# Patient Record
Sex: Female | Born: 1995 | Race: Black or African American | Hispanic: No | Marital: Single | Smoking: Never smoker
Health system: Southern US, Community
[De-identification: ages and names within clinical notes are randomized; demographics above are authoritative.]

---

## 2014-10-24 ENCOUNTER — Other Ambulatory Visit: Payer: Self-pay | Admitting: Nurse Practitioner

## 2014-10-24 DIAGNOSIS — N912 Amenorrhea, unspecified: Secondary | ICD-10-CM

## 2014-10-29 ENCOUNTER — Ambulatory Visit
Admission: RE | Admit: 2014-10-29 | Discharge: 2014-10-29 | Disposition: A | Discharge status: Home / Self Care | Payer: BLUE CROSS/BLUE SHIELD | Source: Ambulatory Visit | Attending: Nurse Practitioner | Admitting: Nurse Practitioner

## 2014-10-29 DIAGNOSIS — N912 Amenorrhea, unspecified: Secondary | ICD-10-CM

## 2014-11-02 ENCOUNTER — Other Ambulatory Visit: Payer: Self-pay | Admitting: Nurse Practitioner

## 2014-11-02 DIAGNOSIS — N838 Other noninflammatory disorders of ovary, fallopian tube and broad ligament: Secondary | ICD-10-CM

## 2014-11-12 ENCOUNTER — Ambulatory Visit
Admission: RE | Admit: 2014-11-12 | Discharge: 2014-11-12 | Disposition: A | Discharge status: Home / Self Care | Payer: BLUE CROSS/BLUE SHIELD | Source: Ambulatory Visit | Attending: Nurse Practitioner | Admitting: Nurse Practitioner

## 2014-11-12 DIAGNOSIS — N838 Other noninflammatory disorders of ovary, fallopian tube and broad ligament: Secondary | ICD-10-CM

## 2014-11-12 MED ORDER — GADOBENATE DIMEGLUMINE 529 MG/ML IV SOLN
20.0000 mL | Freq: Once | INTRAVENOUS | Status: AC | PRN
  Administered 2014-11-12: 20 mL via INTRAVENOUS

## 2014-12-24 ENCOUNTER — Ambulatory Visit (INDEPENDENT_AMBULATORY_CARE_PROVIDER_SITE_OTHER): Payer: BLUE CROSS/BLUE SHIELD | Admitting: Internal Medicine

## 2014-12-24 ENCOUNTER — Encounter: Payer: Self-pay | Admitting: Internal Medicine

## 2014-12-24 VITALS — BP 122/82 | HR 72 | Temp 98.1°F | Ht 70.5 in | Wt 251.0 lb

## 2014-12-24 DIAGNOSIS — E288 Other ovarian dysfunction: Secondary | ICD-10-CM

## 2014-12-24 DIAGNOSIS — N910 Primary amenorrhea: Secondary | ICD-10-CM

## 2014-12-24 DIAGNOSIS — E2839 Other primary ovarian failure: Secondary | ICD-10-CM

## 2014-12-24 LAB — FOLLICLE STIMULATING HORMONE: FSH: 63.2 m[IU]/mL

## 2014-12-24 LAB — T4, FREE: FREE T4: 0.7 ng/dL (ref 0.60–1.60)

## 2014-12-24 LAB — T3, FREE: T3 FREE: 3.7 pg/mL (ref 2.3–4.2)

## 2014-12-24 LAB — TSH: TSH: 1.26 u[IU]/mL (ref 0.35–5.50)

## 2014-12-24 LAB — LUTEINIZING HORMONE: LH: 20.84 m[IU]/mL

## 2014-12-24 NOTE — Progress Notes (Signed)
Patient ID: Jenna Martinez, female   DOB: [birth year], 19 y.o.   MRN: [id number]  HPI: Jenna Martinez is a 19 y.o. female, referred by her PCP,  Revonda Standard, FNP, in consultation for primary amenorrhea.  She saw an ObGyn at 62-11 y/o as she did not have a menstrual cycle. She had investigation then, but does not remember if she was given a diagnosis. She believes that she did not have breast buds at the time. She started Estrogen 500 mg bid >> started to develop breasts >> stopped Estrogen in Spring 2016.  Pt describes: - no menarche - painful intercourse (due to vaginal dryness) - children: 0 - miscarriages: 0 - contraception: no  She denies acne, hirsutism, significant weight gain, wide, purple stretch marks.  Previous investigation - imaging showed small uterus and ovaries with a cyst in the right ovary that could be a dermoid cyst: 10/29/2014 Transvaginal ultrasound: Small uterus for the patient's age. No endometrial abnormality  2 cm echogenic complex right adnexal/ovarian masslike abnormality separate from surrounding peristalsing bowel, could represent an ovarian dermoid. No other cross-sectional imaging of the pelvis for comparison. Consider further evaluation with a pelvic MRI without and with contrast.  14 mm left ovarian dominant follicle versus small cyst  Trace pelvic free fluid likely physiologic  11/12/2014 Pelvic MRI with and without contrast:  1. Diminutive uterus and ovaries, suggesting a component of hypogonadism or uterine hypoplasia. Correlate with clinical signs of delayed puberty. 2. No evidence of right ovarian or adnexal mass; no correlate for the ultrasound abnormality which may have been artifactual. 3. Trace cul-de-sac fluid may be physiologic.  Lab work 10/24/2014: - TSH 1.802, free T4 0.97 (0.8-1.8) - DHEA 431 ([id number])  - Prolactin 8.4 (2.8-21.2)  - Total testosterone 22 (2-45), SHBG 61 (17-124), free testosterone 2.2  (0.1-6.4) She also has a history of vitamin D deficiency, although I do not have those records.  She has no FH of infertility, amenorrhea. Late menarche in paternal aunt.  ROS: Constitutional: + weight gain, + fatigue, + subjective hyperthermia Eyes: no blurry vision, no xerophthalmia ENT: no sore throat, no nodules palpated in throat, no dysphagia/odynophagia, no hoarseness, + tinnitus Cardiovascular: no CP/+ SOB/no palpitations/leg swelling Respiratory: no cough/+ SOB Gastrointestinal: no N/V/D/C Musculoskeletal: no muscle/joint aches Skin: no acne, no hair on face, no dark discoloration of skin Neurological: no tremors/numbness/tingling/dizziness Psychiatric: no depression/anxiety  PMH: - see HPI  PSxH: - tonsillectomy 2000   Social History   Social History  . Marital Status: Single    Spouse Name: N/A  . Number of Children: 0   Occupational History  . [employer]   Social History Main Topics  . Smoking status: Never Smoker   . Smokeless tobacco: Not on file  . Alcohol Use: 3 drinks 1x a mo  . Drug Use: No   Not taking any prescription medication currently.   No Known Allergies   FH: - diabetes in aunt and cousin, hypertension in aunt  PE: BP 122/82 mmHg  Pulse 72  Temp(Src) 98.1 F (36.7 C) (Oral)  Ht 5' 10.5" (1.791 m)  Wt 251 lb (113.853 kg)  BMI 35.49 kg/m2 Wt Readings from Last 3 Encounters:  12/24/14 251 lb (113.853 kg)   Constitutional: overweight, in NAD, no full supraclavicular fat pads Eyes: PERRLA, EOMI, no exophthalmos ENT: moist mucous membranes, no thyromegaly, no cervical lymphadenopathy Cardiovascular: RRR, No MRG Respiratory: CTA B Gastrointestinal: abdomen soft, NT, ND, BS+ Musculoskeletal: no deformities, strength intact in all 4 Skin: moist,  warm; no acne on face, no dark terminal hair on chin, + acanthosis nigricans on neck, no purple, wide, stretch marks Neurological: no tremor with outstretched hands, DTR normal in all  4  ASSESSMENT: 1. Primary amenorrhea  PLAN: 1.  I had a long discussion with the patient about possible causes of primary amenorrhea (excluding disorders with absent uterus and ovaries, since she has these, although small, on ultrasound)  She is not on any medicines that can cause this problem  No excessive exerciser dieting to suggest functional hypothalamic amenorrhea  No chronic systemic illnesses: celiac disease or DM1, to trigger hypogonadotropic hypogonadism  No galactorrhea or elevated prolactin to suggest a prolactinoma  No hypothyroidism/hyperthyroidism per review of recent TSH  doubt outflow obstruction based on the results of her pelvic ultrasound and her MRI  She does not have other features of PCOS: acne, hirsutism, high testosterone, multiple ovarian cysts. We discussed about what the syndrome means. I will recheck a testosterone, androstenedione, LH, FSH  She does not have other autoimmune diseases to suggest autoimmune premature ovarian insufficiency, but I would like to check adrenal antibodies since this is the best test to evaluate for ovarian autoimmunity (not ovarian antibodies, which are not specific enough). We discussed about POI and the fact that if she does have this, she will need hormone replacement therapy to preserve her bones. I explained that usually the treatment is with estradiol patch and po Prometrium.  To further investigate for premature ovarian insufficiency, I will check an inhibin B level to evaluate her ovarian reserve  Doubt Netarts-CAH because no signs of hyperandrogenism, but would like to check a 17 hydroxyprogesterone level to make sure  We may need to rule out fragile X syndrome by FMR testing   Doubt Turner syndrome since she is tall, but she may have a Turner mosaic profile - patient tells me that she had a karyotype during her evaluation as a child and was told that "she is a woman". We'll try to obtain the records. Patient signed a release  of information sheet. - I discussed with her that if she turns out to have POI, we may refer her to genetics to see if we can find out the exact cause for this, if the above investigation is negative. - Patient would like to know if she can never have children, and we discussed that I am not sure, we may need to refer to reproductive endocrinology for an evaluation. She does not plan to get pregnant right now, but she would like to know what her chances are. - I will order the following labs: Orders Placed This Encounter  Procedures  . Follicle stimulating hormone  . Luteinizing hormone  . T3, free  . T4, free  . TSH  . 21-Hydroxylase Antibodies  . 17-Hydroxyprogesterone  . Testosterone,Free and Total  . Androstenedione  . DHEA-sulfate  . Estradiol  . Inhibin B  . Anti mullerian hormone   - time spent with the patient: 1 hour, of which >50% was spent in obtaining information about her amenorrhea, reviewing her previous labs, evaluations, and treatments, counseling her about her condition (please see the discussed topics above), and developing a plan to further investigate it; she had a number of questions which I addressed.  Office Visit on 12/24/2014  Component Date Value Ref Range Status  . [facility] 12/24/2014 63.2   Final   Female Reference Range:  1.4-18.1 mIU/mLFemale Reference Range:Follicular Phase  2.5-10.2 mIU/mLMidCycle Peak          3.4-33.4 mIU/mLLuteal Phase          1.5-9.1 mIU/mLPost Menopausal     23.0-116.3 mIU/mLPregnant          <0.3 mIU/mL  . [facility] 12/24/2014 20.84   Final   Comment: Female Reference Range:20-70 yrs     1.5-9.3 mIU/mL>70 yrs       3.1-35.6 mIU/mLFemale Reference Range:Follicular Phase     1.9-12.5 mIU/mLMidcycle             8.7-76.3 mIU/mLLuteal Phase         0.5-16.9 mIU/mL  Post Menopausal      15.9-54.0  mIU/mLPregnant             <1.5 mIU/mLContraceptives       0.7-5.6 mIU/mL   . T3, Free 12/24/2014 3.7  2.3 - 4.2 pg/mL Final  . Free T4 12/24/2014  0.70  0.60 - 1.60 ng/dL Final  . TSH 16/02/9603 1.26  0.35 - 5.50 uIU/mL Final  . 21-Hydroxylase Antibodies 12/24/2014 <1.0   Final   Comment: Reference Range: All Ages: < 1.0   . 17-OH-Progesterone, LC/MS/MS 12/24/2014 9   Final   Comment:   Adult Female Reference Ranges for   17-Hydroxyprogesterone, LC/MS/MS:      Follicular Phase:       < or = 185 ng/dL    Luteal Phase:           < or = 285 ng/dL    Postmenopausal Phase:   < or = 45 ng/dL    Pregnancy:    First Trimester:  78-457 ng/dL    Second Trimester: 54-098 ng/dL    Third Trimester:  119-147 ng/dL   . Testosterone 12/24/2014 21  8 - 48 ng/dL Final  . Testosterone, Free 12/24/2014 1.3  0.0 - 4.2 pg/mL Final  . Androstenedione 12/24/2014 63   Final   Comment:   Adult Female Reference Ranges for   Androstenedione, Serum:      Follicular Phase:      35-250 ng/dL    Luteal Phase:          30-235 ng/dL    Postmenopausal Phase:  20-75  ng/dL   . DHEA-SO4 12/24/2014 284  51 - 321 ug/dL Final   Comment:         Tanner stages   (7-17 Years)                 Female            Female Tanner I        <or=89 ug/dL    <WG=95 ug/dL Tanner II       <AO=13 ug/dL    08-657 ug/dL Tanner III      84-696 ug/dL    29-528 ug/dL Tanner IV       41-324 ug/dL    40-102 ug/dL Tanner V       725-366 ug/dL    44-034 ug/dL   DHEA-S values fall with advancing age. For reference, the reference intervals for 77-24 year old patients are: Female 23-266 ug/dL and Female 742-595 ug/dL.   . Estradiol 12/24/2014 16.3   Final   Comment:      Males                           0.0 -  39.0 pg/mL      Menstruating Females (by day in  cycle relative to LH peak)      Follicular phase (-12 to -4)   19.5 - 144.2 pg/mL      Midcycle          (-3 to +2)   63.9 - 356.7 pg/mL        Postmenopausal Females          0.0 -  32.2 pg/mL      (untreated)     . Inhibin B 12/24/2014 <10   Final   Comment:                  REFERENCE RANGE for Inhibin B                       =============================         Age:                Female:               Female:     5 - 9.9 years      21 - 166 pg/mL       < or = 18 pg/mL    10 - 13.9 years     41 - 328 pg/mL       < or = 86 pg/mL    14 - 17.9 years     54 - 295 pg/mL       < or = 123 pg/mL       > = 18 years     47 - 308 pg/mL   Female:   Pre-menopausal   < 153 pg/mL           Post-menopausal   < 10 pg/mL     Values obtained from different assay methods cannot be   used interchangeably. Inhibin B levels, regardless of   value, should not be interpreted as absolute evidence   of the presence or absence of disease.    This test was developed and its analytical performance  characteristics have been determined by [facility] [place], [place]. It has not been cleared or approved by  the FDA. This assay has been validated pursuant to the CLIA  regulations and is used for clinical purposes.   . AMH AssessR 12/24/2014 < 0.03   Final   Comment:    REFERENCE RANGES for AMH/MIS:                           Age         Expected range                                         (ng/mL)        -------------------------------------------        Female:          <14 yrs        0.49-3.15                       14-19 yrs        1.28-16.37                       20-29 yrs        0.76-11.34  30-39 yrs            <9.24                       40-49 yrs            <4.50                        > 49 yrs            <0.45          Female:            <1  yr         37.20-345.67                        1-6  yrs        59.54-320.65                        7-11 yrs        40.99-203.67                       12-17 yrs           <128.29                        > 17 yrs        1.15-15.23    This test(s) was developed and its performance characteristics  have been determined by [employer],  [place], [place]. Performance characteristics refer to the analytical  performance of the  test. 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 She has estrogen >> some ovarian fxn present. Inhibin B and AMH undetectable >> very decreased ovarian reserve.  The rest of the labs are normal: - Adrenal antibodies undetectable >> no sign of adrenal or ovarian autoimmunity. - TFTs normal >> no hypothyroidism - testosterone not high >> no PCOS or ovulation - androstenedione/DHEAS normal >> no excess adrenal androgens - 17 hydroxyprogesterone normal >> no congenital adrenal hyperplasia  Will check a Karyotype and an FMR mutation next. I will addend the results when they become available. If negative >> will refer to genetics.

## 2014-12-24 NOTE — Patient Instructions (Signed)
Please stop at the lab.  Please come back for a follow-up appointment in 3 months  

## 2014-12-25 LAB — ESTRADIOL: Estradiol: 16.3 pg/mL

## 2014-12-25 LAB — DHEA-SULFATE: DHEA-SO4: 284 ug/dL (ref 51–321)

## 2014-12-27 LAB — ANDROSTENEDIONE: Androstenedione: 63 ng/dL

## 2014-12-27 LAB — 17-HYDROXYPROGESTERONE: 17-OH-PROGESTERONE, LC/MS/MS: 9 ng/dL

## 2015-01-03 LAB — TESTOSTERONE,FREE AND TOTAL
Testosterone, Free: 1.3 pg/mL (ref 0.0–4.2)
Testosterone: 21 ng/dL (ref 8–48)

## 2015-01-03 LAB — INHIBIN B: Inhibin B: <10 pg/mL

## 2015-01-03 LAB — ANTI MULLERIAN HORMONE: AMH AssessR: <0.03 ng/mL

## 2015-01-03 LAB — 21-HYDROXYLASE ANTIBODIES: 21-Hydroxylase Antibodies: <1 u/mL

## 2015-01-04 DIAGNOSIS — N910 Primary amenorrhea: Secondary | ICD-10-CM | POA: Insufficient documentation

## 2015-03-25 ENCOUNTER — Ambulatory Visit: Payer: BLUE CROSS/BLUE SHIELD | Admitting: Internal Medicine

## 2015-03-25 ENCOUNTER — Telehealth: Payer: Self-pay | Admitting: Internal Medicine

## 2015-03-25 NOTE — Telephone Encounter (Signed)
Judeth CornfieldStephanie, I will cancel this appt.

## 2015-03-25 NOTE — Telephone Encounter (Signed)
[facility] DOB: [birth year] Age: [age] Y 9 M 26 D Return Phone Number: [phone] (Primary) Address: City/State/Zip:  Client [place] [facility] Night - Client Client Site  Endocrinology Contact Type Call Caller Name Burns SpainKala Epkins-Henderson Caller Phone Number [phone] Relationship To Patient Self Is this call to report lab results? No Call Type General Information Initial Comment Caller stated she needs to cancel her appointment. Caller stated she is supposed to come in tomorrow at 10:00am General Information Type Appointment Nurse Assessment Guidelines Guideline Title Affirmed Question Affirmed Notes Nurse Date

## 2017-02-23 IMAGING — MR MR PELVIS WO/W CM
7 of 10 series · 31 of 48 positions shown · IV contrast (multihance)
Comparison: 10/29/2014 ultrasound.

CLINICAL DATA: Pelvic ultrasound demonstrating 2 cm right adnexal/
ovarian lesion suspicious for dermoid.

EXAM:
MRI PELVIS WITHOUT AND WITH CONTRAST
TECHNIQUE: Multiplanar multisequence MR imaging of the pelvis was performed
both before and after administration of intravenous contrast.
CONTRAST:  20mL MULTIHANCE GADOBENATE DIMEGLUMINE 529 MG/ML IV SOLN

[Series 3: T2 · coronal · 6.0mm · 0.78mm/px · 4 of 26 slices shown]
[im 1/26]
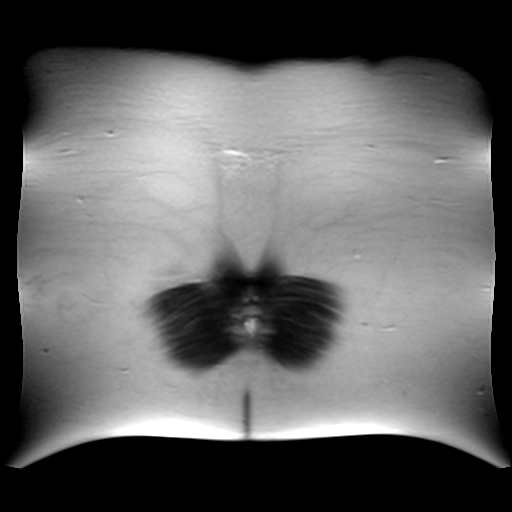
[im 9/26]
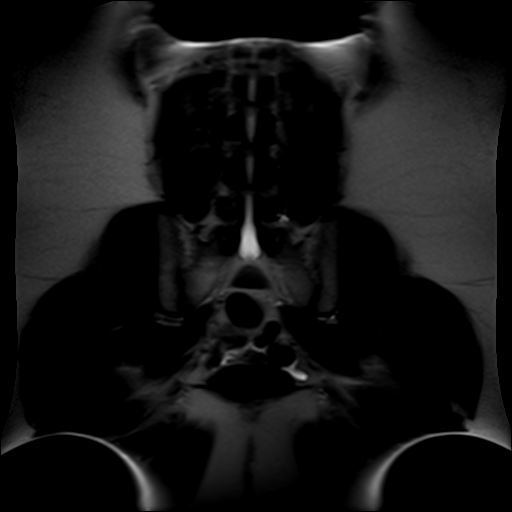
[im 17/26]
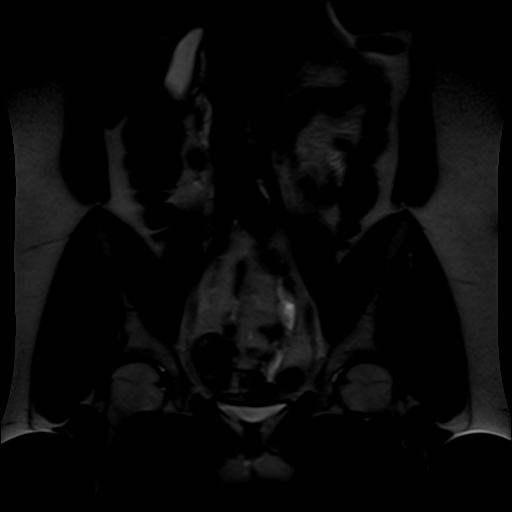
[im 26/26]
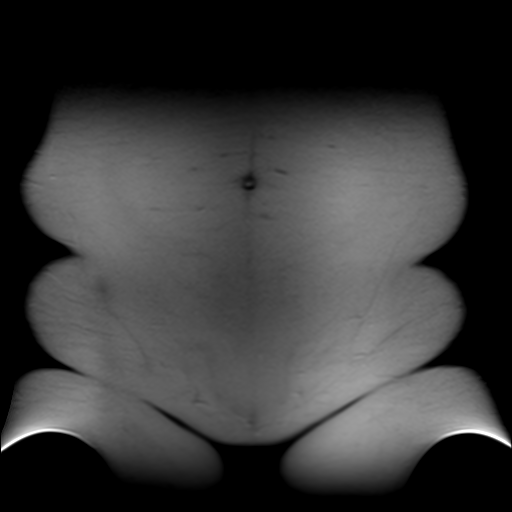

[Series 4: t2_tse_sag · sagittal · 5.0mm · 0.51mm/px · 4 of 25 slices shown]
[im 1/25]
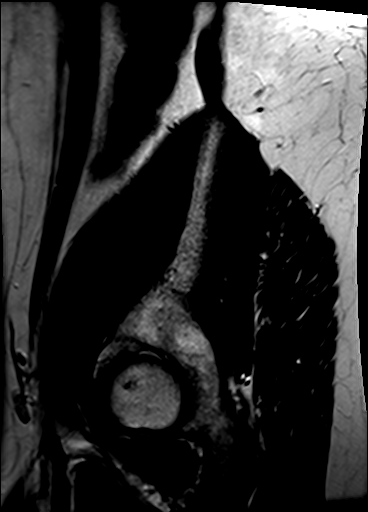
[im 9/25]
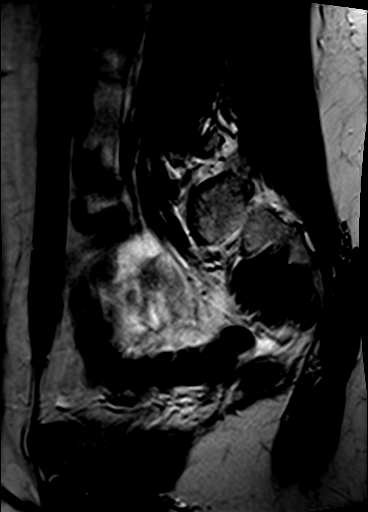
[im 17/25]
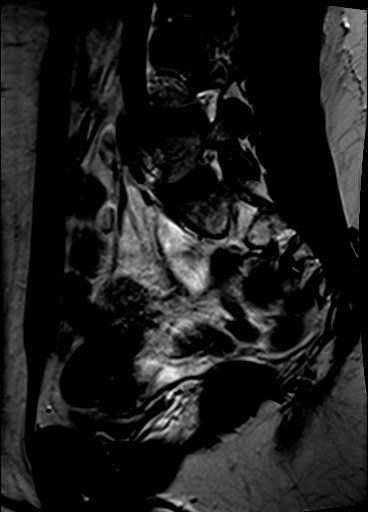
[im 25/25]
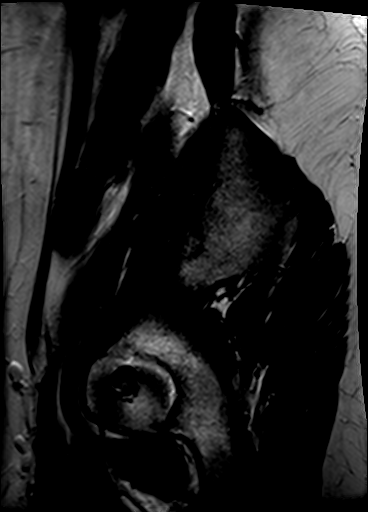

[Series 5: t2_tse axial · axial · 5.0mm · 0.51mm/px · z∈[-102,+54]mm · 5 of 27 slices shown]
[im 1/27]
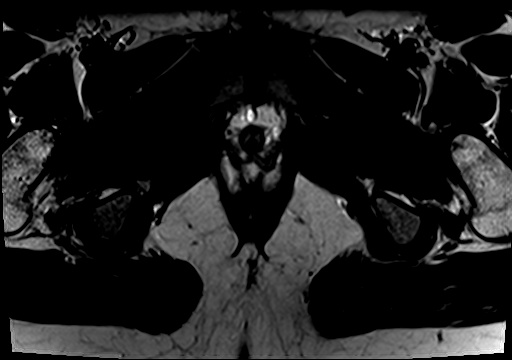
[im 7/27]
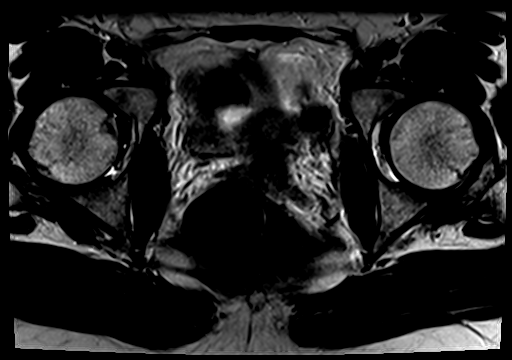
[im 14/27]
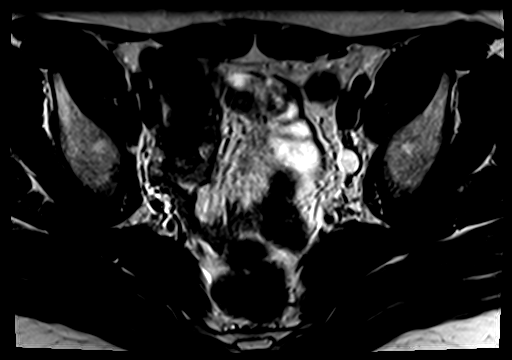
[im 20/27]
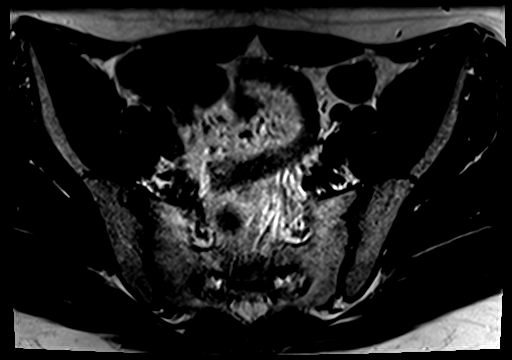
[im 27/27]
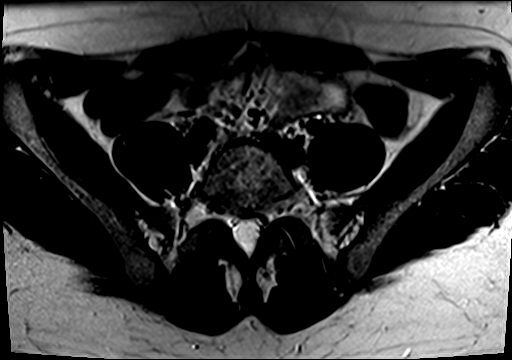

[Series 6: t2_tse axial fs · axial · 5.0mm · 0.51mm/px · z∈[-102,+54]mm · 5 of 27 slices shown]
[im 1/27]
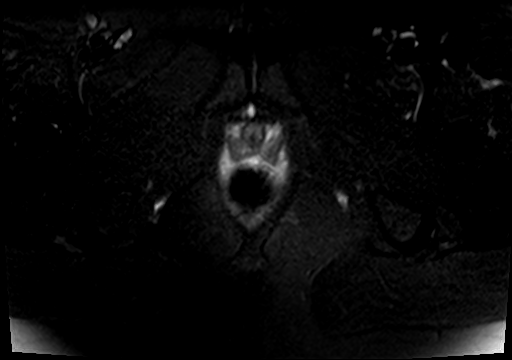
[im 7/27]
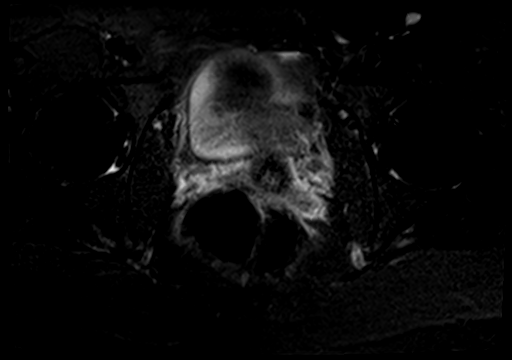
[im 14/27]
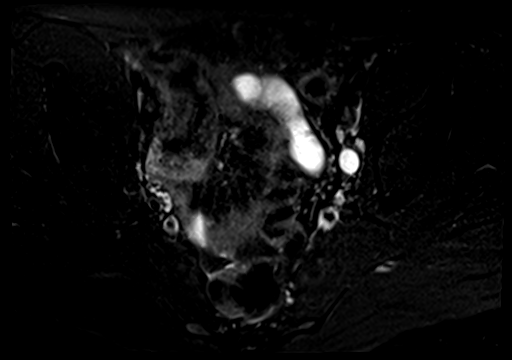
[im 20/27]
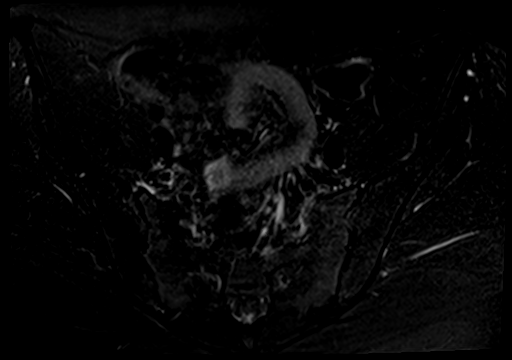
[im 27/27]
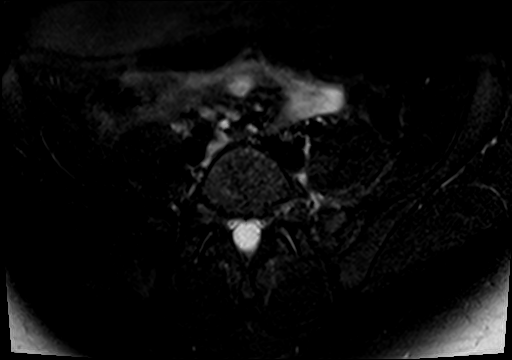

[Series 7: axial spgr · axial · 5.0mm · 0.51mm/px · z∈[-102,+54]mm · 5 of 27 slices shown]
[im 1/27]
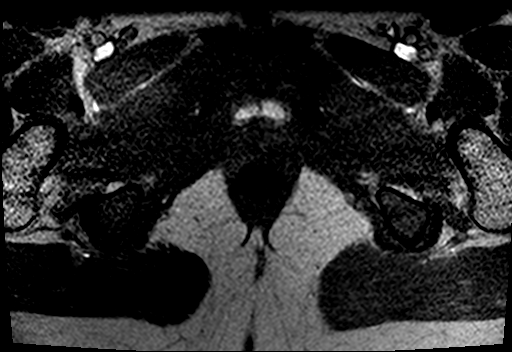
[im 7/27]
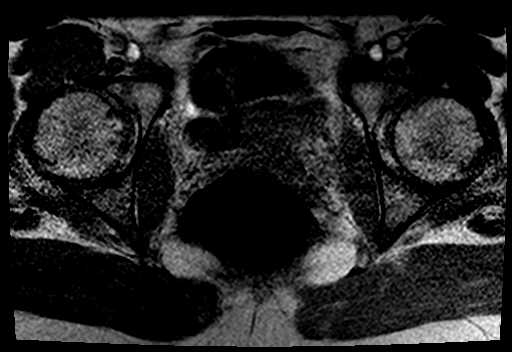
[im 14/27]
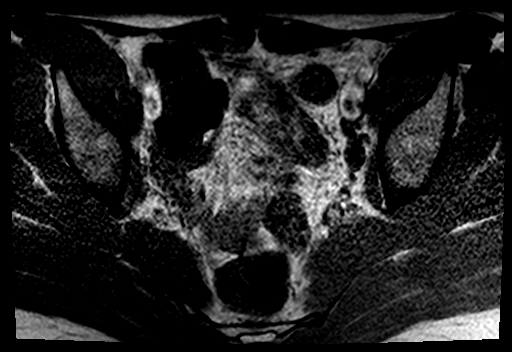
[im 20/27]
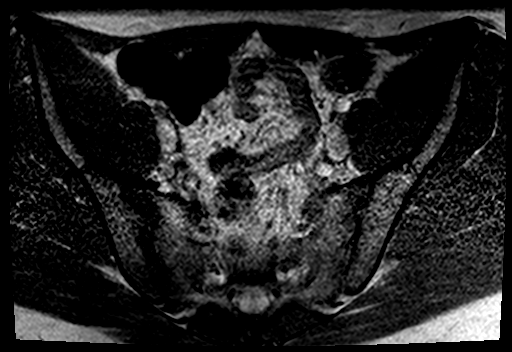
[im 27/27]
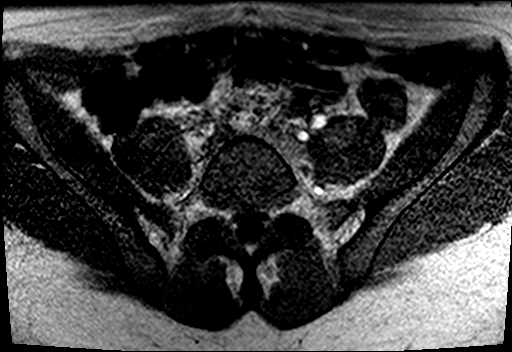

[Series 8: axial spgr fs · axial · non-contrast · 5.0mm · 1.02mm/px · z∈[-102,+54]mm · 5 of 27 slices shown]
[im 1/27]
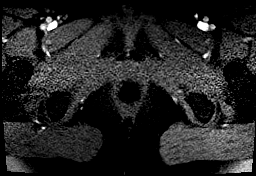
[im 7/27]
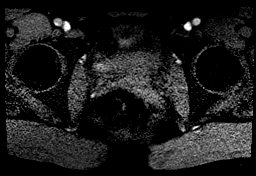
[im 14/27]
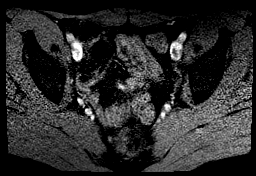
[im 20/27]
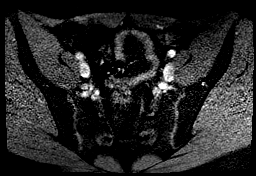
[im 27/27]
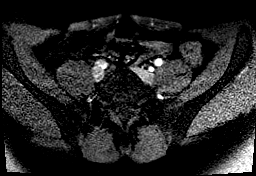

[Series 9: axial spgr post · axial · 5.0mm · 1.02mm/px · z∈[-102,-24]mm · 3 of 27 slices shown]
[im 1/27]
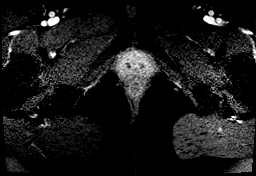
[im 7/27]
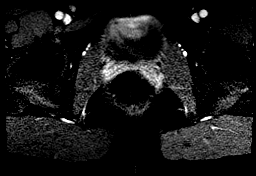
[im 14/27]
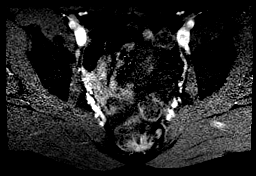

[31 of 48 positions shown; findings below may reference images not displayed]

FINDINGS: Normal pelvic bowel loops. No pelvic adenopathy. Normal urinary
bladder.

The uterus is small for age. Measures on the order of 4.2 x 1.6 cm
on sagittal image 14 of series [DATE] cm medial lateral on
transverse image 20 of series 5. Endometrium not well evaluated.

The left ovary is diminutive, with a small follicle identified
within on image 14 of series 5. The right ovary is not confidently
identified, possibly visualized on image 13 of series 5. Likely also
diminutive for age.

No evidence of ovarian or adnexal mass.  Trace cul-de-sac fluid.
IMPRESSION: 1. Diminutive uterus and ovaries, suggesting a component of
hypogonadism or uterine hypoplasia. Correlate with clinical signs of
delayed puberty.
2. No evidence of right ovarian or adnexal mass; no correlate for
the ultrasound abnormality which may have been artifactual.
3. Trace cul-de-sac fluid may be physiologic.

## 2018-02-11 ENCOUNTER — Other Ambulatory Visit: Payer: Self-pay | Admitting: Family Medicine

## 2018-02-11 DIAGNOSIS — N63 Unspecified lump in unspecified breast: Secondary | ICD-10-CM

## 2018-02-24 ENCOUNTER — Other Ambulatory Visit: Payer: Self-pay | Admitting: Family Medicine

## 2018-02-24 ENCOUNTER — Ambulatory Visit
Admission: RE | Admit: 2018-02-24 | Discharge: 2018-02-24 | Disposition: A | Discharge status: Home / Self Care | Payer: BC Managed Care – PPO | Source: Ambulatory Visit | Attending: Family Medicine | Admitting: Family Medicine

## 2018-02-24 DIAGNOSIS — N63 Unspecified lump in unspecified breast: Secondary | ICD-10-CM

## 2018-02-24 DIAGNOSIS — E0789 Other specified disorders of thyroid: Secondary | ICD-10-CM

## 2018-02-24 DIAGNOSIS — E041 Nontoxic single thyroid nodule: Secondary | ICD-10-CM

## 2018-02-24 DIAGNOSIS — R221 Localized swelling, mass and lump, neck: Secondary | ICD-10-CM

## 2020-06-07 IMAGING — US ULTRASOUND RIGHT BREAST LIMITED
1 series · 5 of 5 positions shown · non-contrast
Comparison: Previous exam(s).

ADDENDUM:
The comparison section of the report is incorrect. There are no
previous examinations for comparison.
CLINICAL DATA: Palpable thickening and tenderness in the upper
outer right breast prior to her last menstrual period. This has
become significantly less prominent and tender following her
menstrual period. No family history of breast cancer.

EXAM:
ULTRASOUND OF THE RIGHT BREAST

[Series 1: ultrasound right breast limited · 0.07mm/px · 5 of 5 slices shown]
[im 1/5]
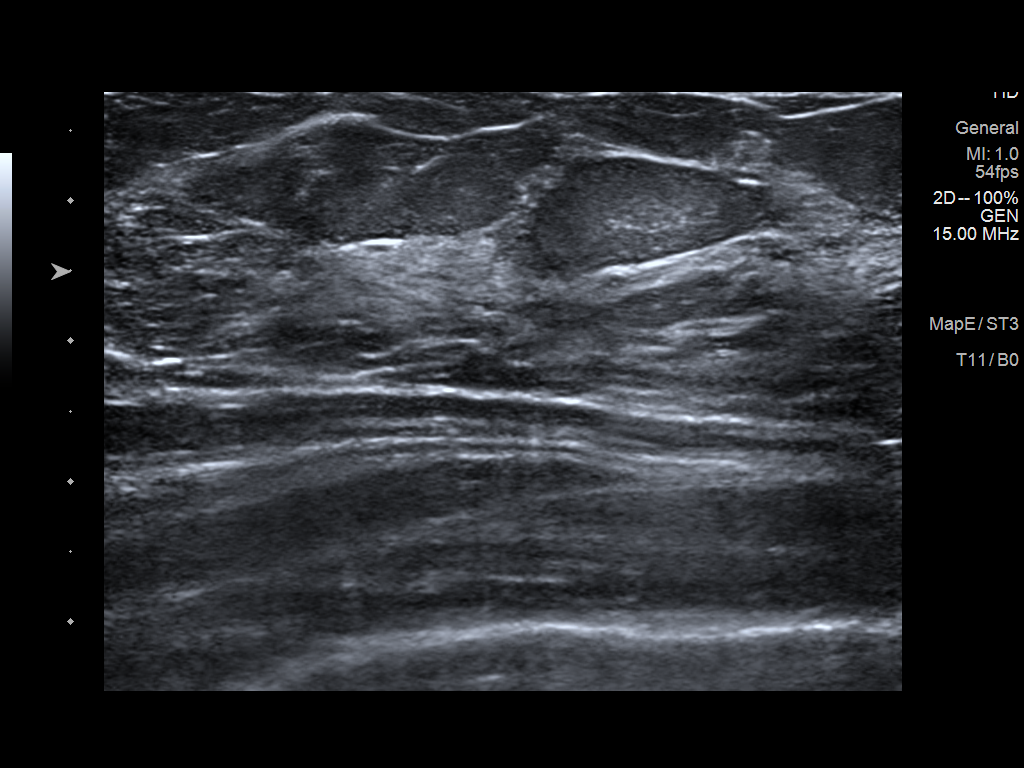
[im 2/5]
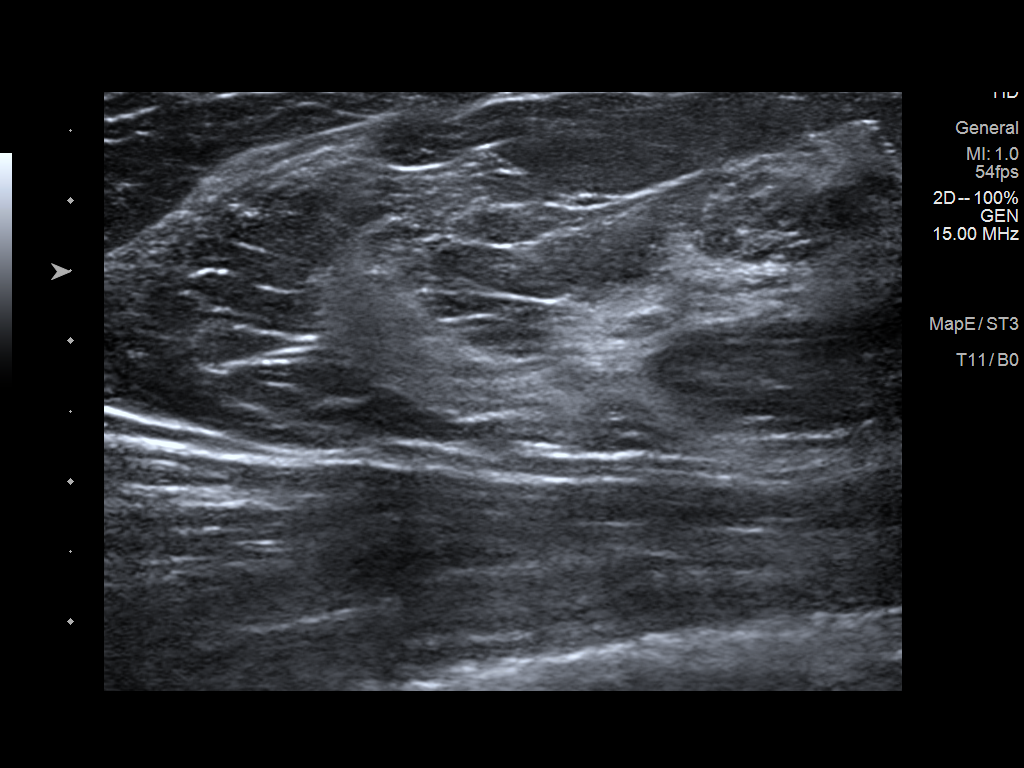
[im 3/5]
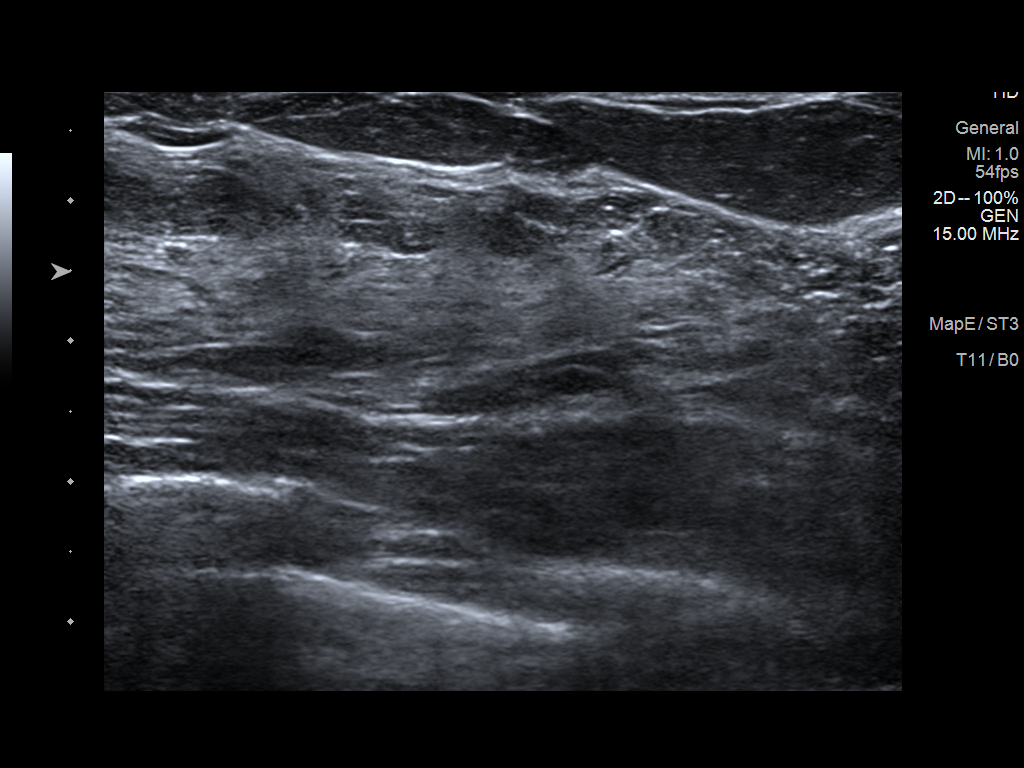
[im 4/5]
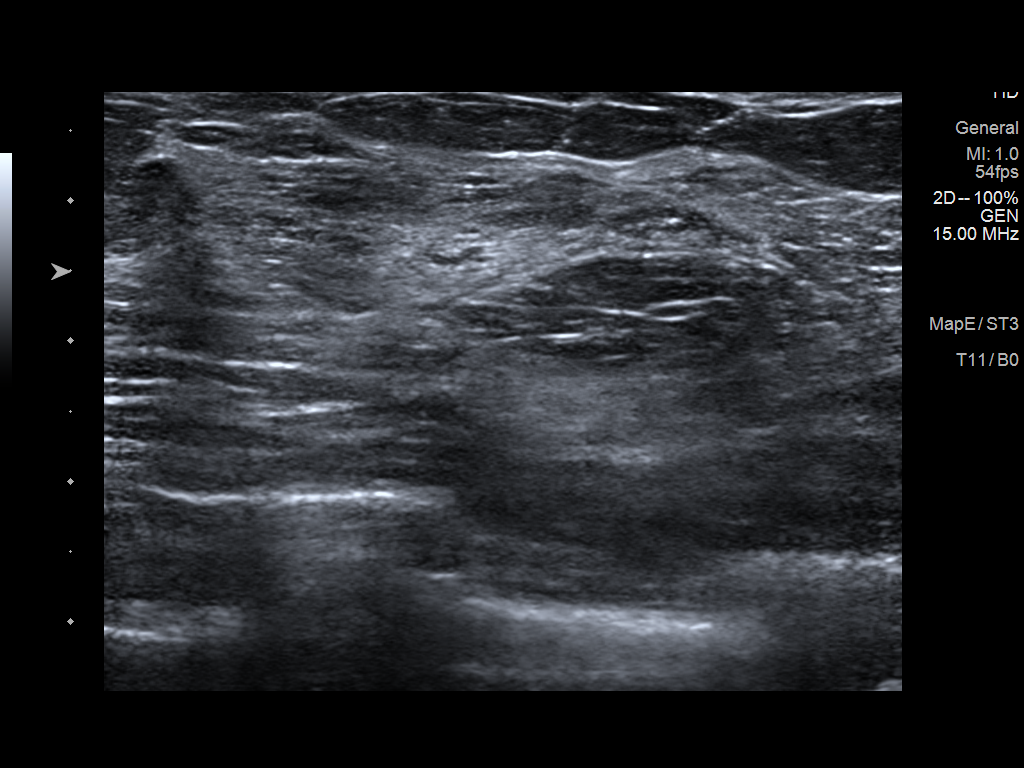
[im 5/5]
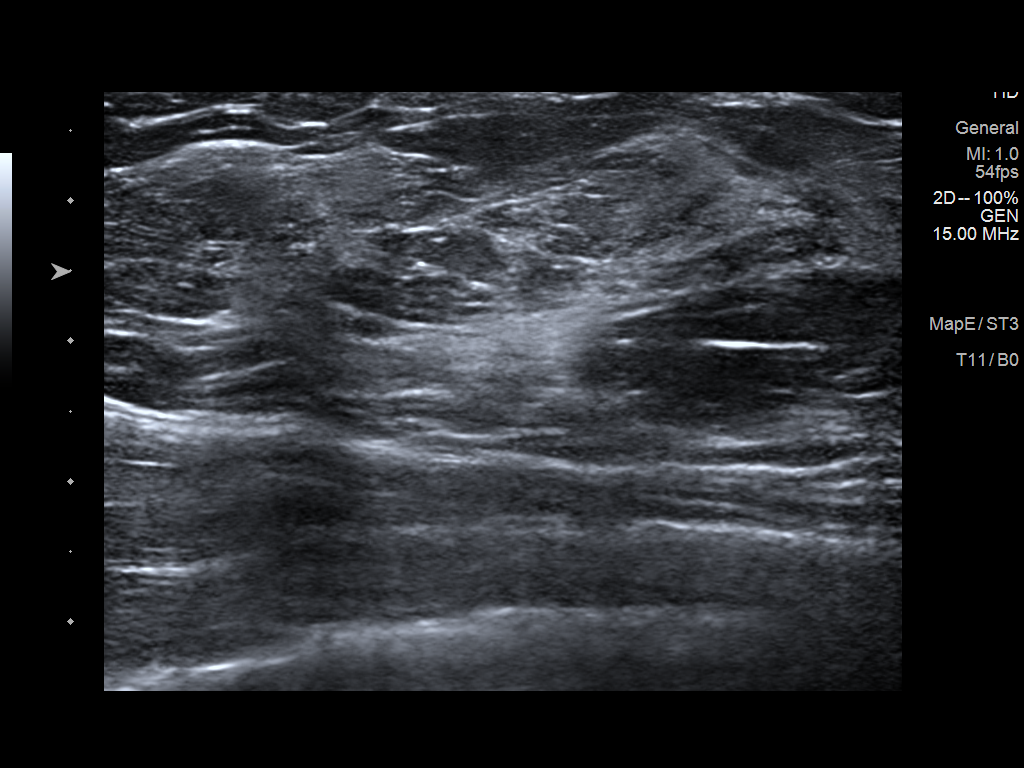

[5 of 5 positions shown; findings below may reference images not displayed]

FINDINGS: On physical exam, there is an approximately 6 x 5 cm oval area of
palpable soft tissue thickening in the upper outer right breast in
the area of patient concern.

Targeted ultrasound is performed, showing normal appearing breast
tissue throughout the upper outer right breast, including
heterogeneously dense fibroglandular tissue, corresponding to the
palpable thickening.
IMPRESSION: No evidence of malignancy.

RECOMMENDATION:
Annual screening mammography beginning at age 40.

I have discussed the findings and recommendations with the patient.
Results were also provided in writing at the conclusion of the
visit. If applicable, a reminder letter will be sent to the patient
regarding the next appointment.

BI-RADS CATEGORY  1: Negative.
